# Patient Record
Sex: Male | Born: 2000
Health system: Southern US, Community
[De-identification: ages and names within clinical notes are randomized; demographics above are authoritative.]

## PROBLEM LIST (undated history)

## (undated) DIAGNOSIS — R430 Anosmia: Secondary | ICD-10-CM

---
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
## 2022-01-05 ENCOUNTER — Ambulatory Visit: Payer: 59 | Admitting: Physician Assistant

## 2022-01-05 IMAGING — DX DG CHEST 2V
2 series · 2 of 2 positions shown · non-contrast
Comparison: None.

CLINICAL DATA: Sore throat, malaise, cough, and fever starting
today

EXAM:
CHEST - 2 VIEW

[chest pa]
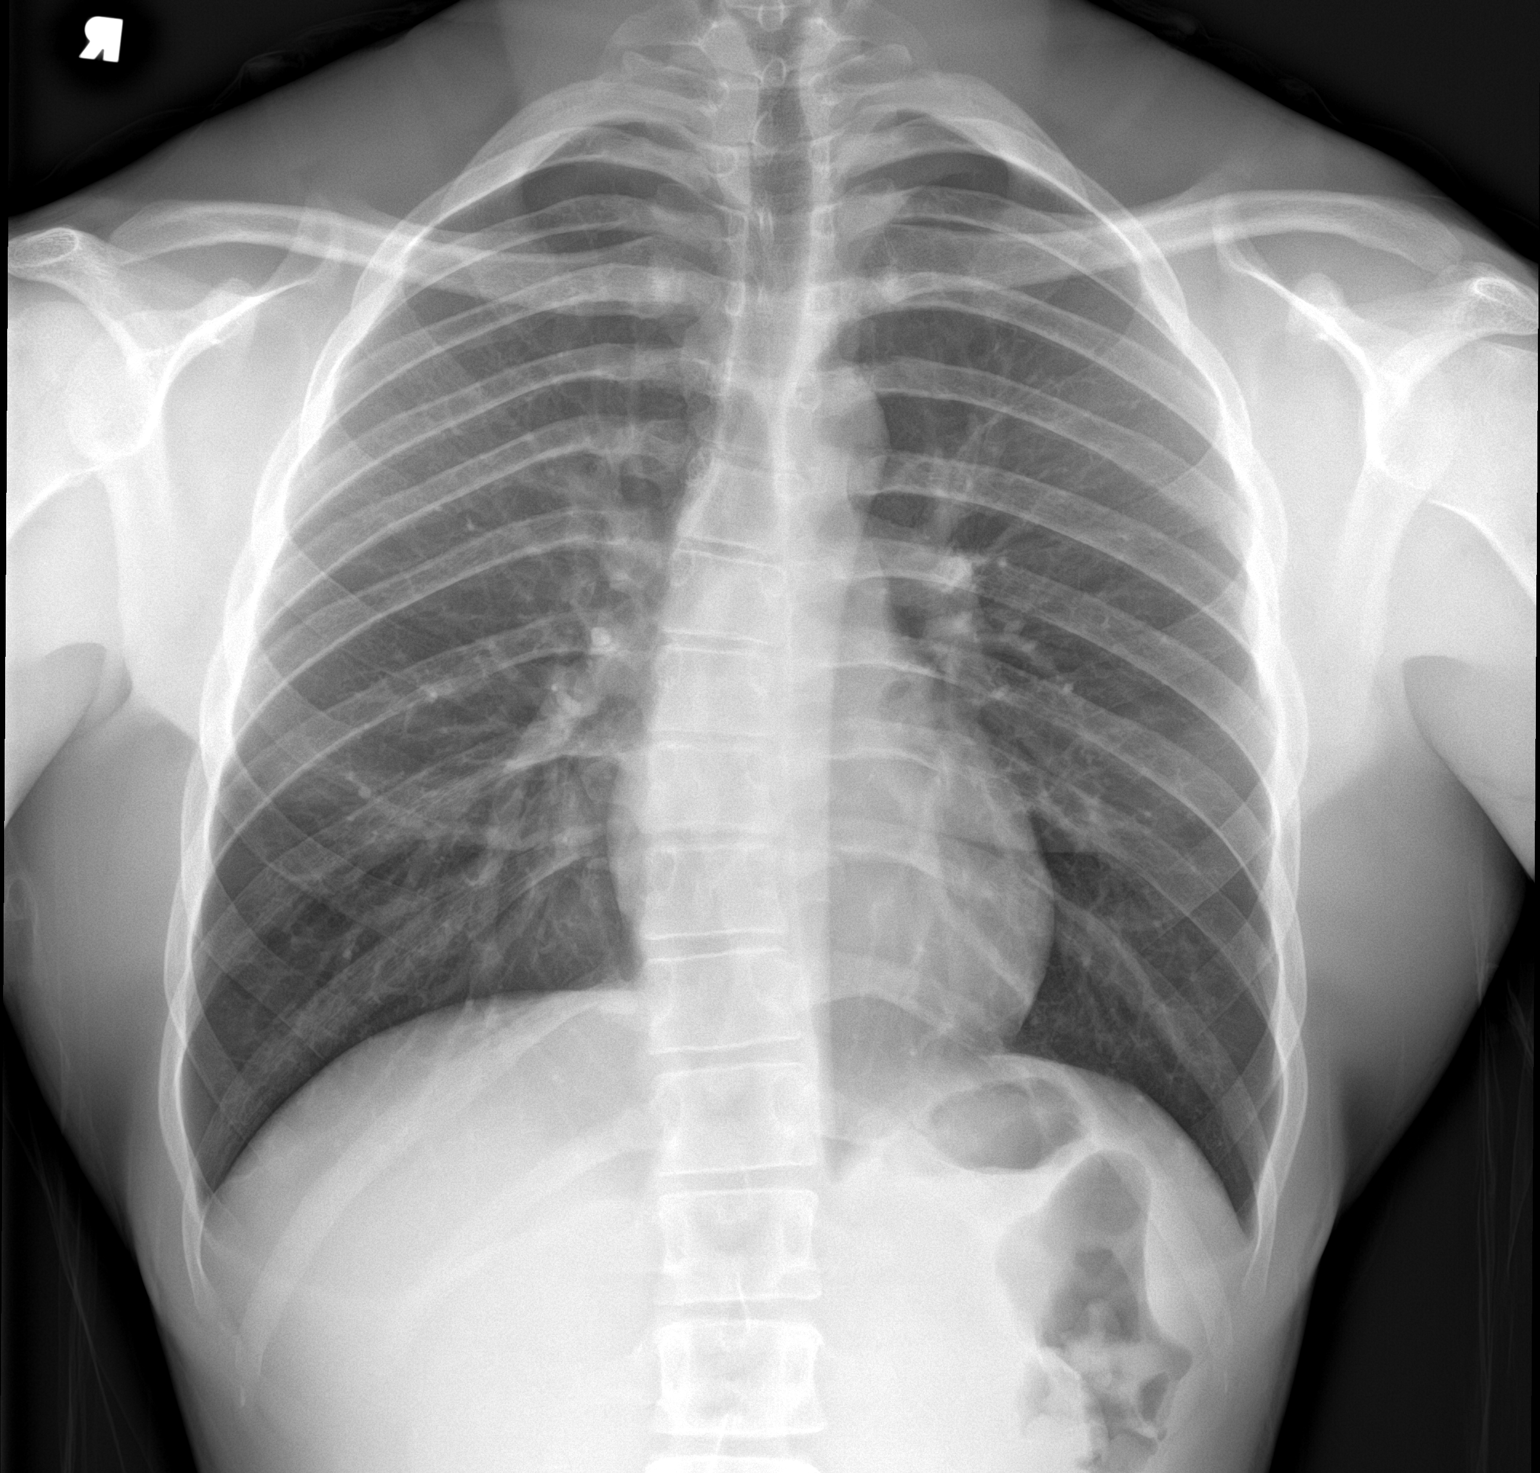

[chest lat]
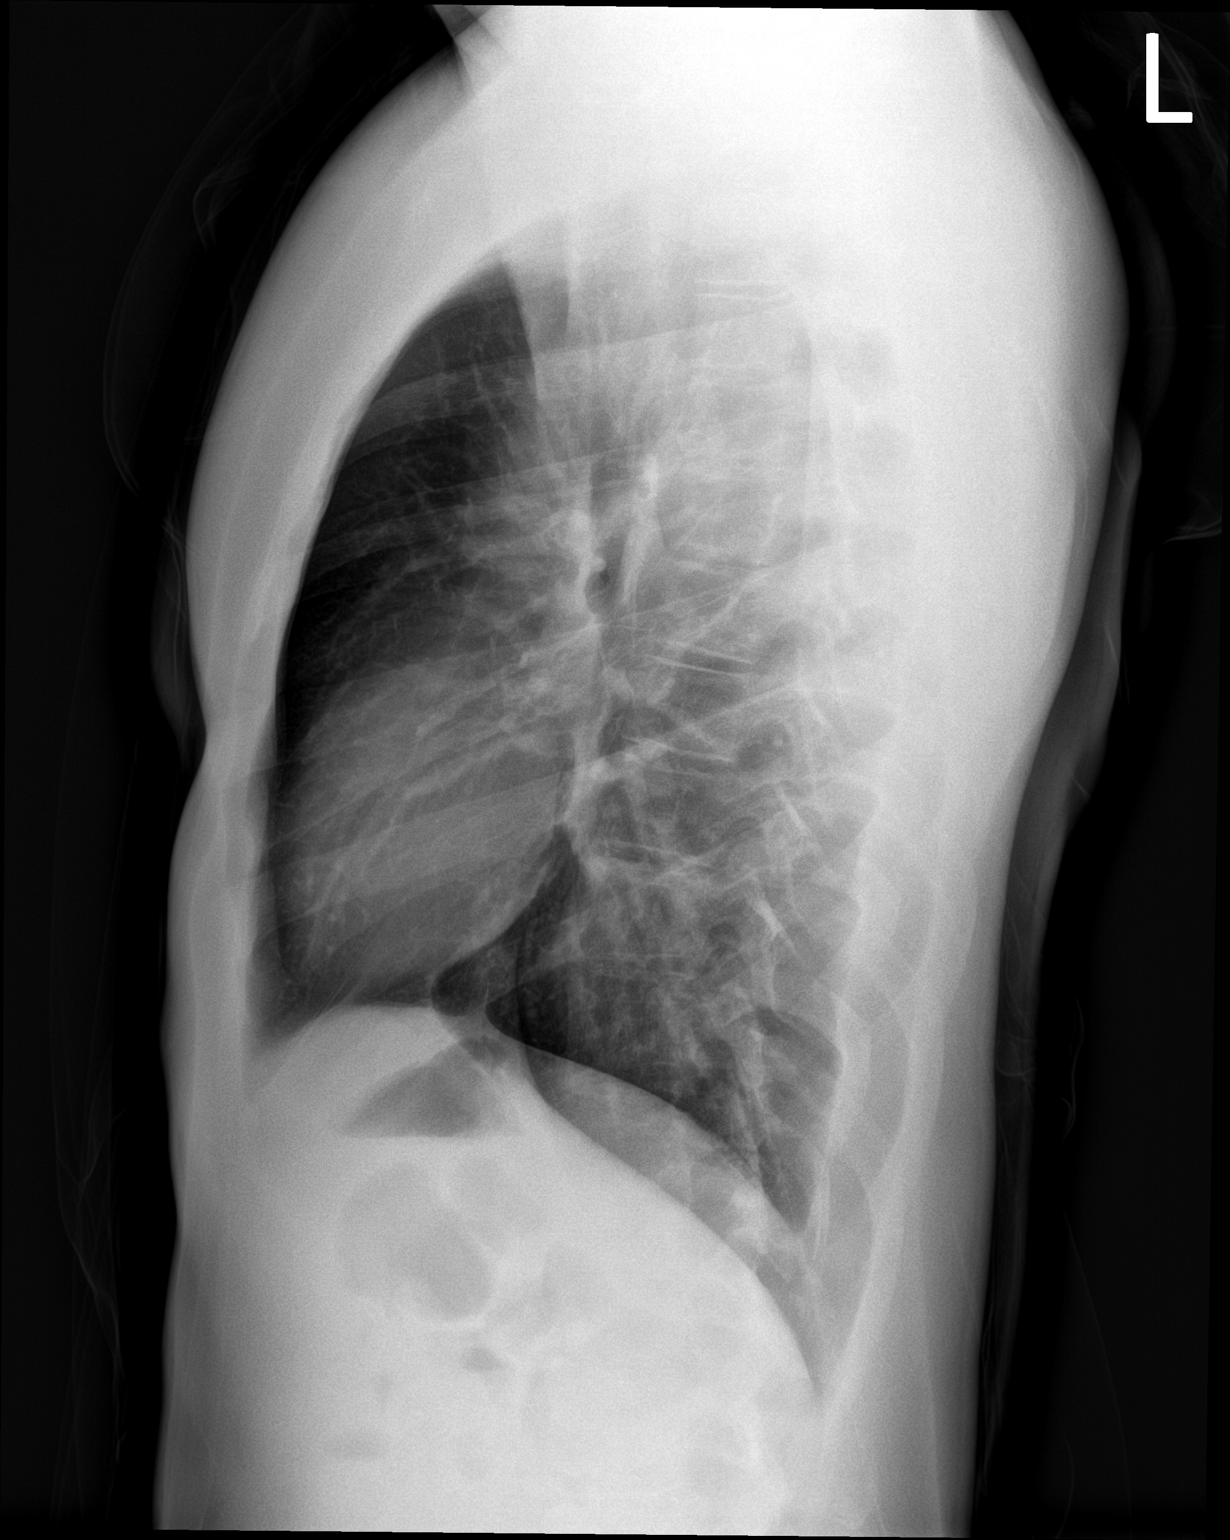

[2 of 2 positions shown; findings below may reference images not displayed]

FINDINGS: The lungs appear clear. Cardiac and mediastinal contours normal. No
pleural effusion identified.

There is 17 degrees of dextroconvex thoracic scoliosis as measured
between T6 and T11. No thoracic vertebral anomalies identified.
IMPRESSION: 1. The lungs appear clear.
2. 17 degrees of dextroconvex thoracic scoliosis between T6 and T11.

## 2022-01-05 NOTE — Progress Notes (Incomplete)
John Miles is a 21 y.o. male here to establish care.  SCRIBE STATEMENT  History of Present Illness:   No chief complaint on file.   Acute Concerns: ***  Chronic Issues: ***  Health Maintenance: Immunizations -- *** Colonoscopy -- *** Mammogram -- *** PAP -- *** Bone Density -- *** PSA -- *** Diet -- *** Caffeine intake -- *** Sleep habits -- *** Exercise -- *** Weight -- @FLOWAMB (14)@  Mood -- *** Alcohol -- *** Tobacco -- ***  No flowsheet data found.  No flowsheet data found.   Other providers/specialists: Patient Care Team: Wilfred Lacy, MD as PCP - General (Pediatrics)   Past Medical History:  Diagnosis Date   Congenital anosmia      Social History   Tobacco Use   Smoking status: Never   Smokeless tobacco: Never  Substance Use Topics   Alcohol use: Never   Drug use: Never    No past surgical history on file.  Family History  Problem Relation Age of Onset   Asthma Mother    Ulcers Mother    Colitis Mother    Irritable bowel syndrome Mother    Migraines Mother    Thyroid disease Mother    Hypertension Maternal Grandmother     No Known Allergies   Current Medications:   Current Outpatient Medications:    cetirizine (ZYRTEC) 10 MG tablet, Take 10 mg by mouth daily as needed for allergies. (Patient not taking: Reported on 11/13/2020), Disp: , Rfl:    Review of Systems:   ROS Negative unless otherwise specified per HPI. Vitals:   There were no vitals filed for this visit.    There is no height or weight on file to calculate BMI.  Physical Exam:   Physical Exam  Results for orders placed or performed during the hospital encounter of 11/12/21  Resp Panel by RT-PCR (Flu A&B, Covid) Nasopharyngeal Swab   Specimen: Nasopharyngeal Swab; Nasopharyngeal(NP) swabs in vial transport medium  Result Value Ref Range   SARS Coronavirus 2 by RT PCR POSITIVE (A) NEGATIVE   Influenza A by PCR NEGATIVE NEGATIVE   Influenza B by PCR  NEGATIVE NEGATIVE  Group A Strep by PCR   Specimen: Nasopharyngeal Swab; Sterile Swab  Result Value Ref Range   Group A Strep by PCR NOT DETECTED NOT DETECTED    Assessment and Plan:       I,Havlyn C Ratchford,acting as a scribe for [employer], PA.,have documented all relevant documentation on the behalf of Inda Coke, PA,as directed by  Inda Coke, PA while in the presence of Inda Coke, [place].  ***  Inda Coke, PA-C

## 2022-02-24 ENCOUNTER — Other Ambulatory Visit: Payer: Self-pay

## 2022-02-24 ENCOUNTER — Ambulatory Visit
Admission: EM | Admit: 2022-02-24 | Discharge: 2022-02-24 | Disposition: A | Discharge status: Home / Self Care | Payer: 59 | Attending: Physician Assistant | Admitting: Physician Assistant

## 2022-02-24 DIAGNOSIS — J029 Acute pharyngitis, unspecified: Secondary | ICD-10-CM

## 2022-02-24 LAB — POCT RAPID STREP A (OFFICE): Rapid Strep A Screen: NEGATIVE

## 2022-02-24 MED ORDER — AMOXICILLIN 500 MG PO CAPS: 500.0000 mg | ORAL_CAPSULE | Freq: Three times a day (TID) | ORAL | 0 refills | Status: DC

## 2022-02-24 NOTE — ED Provider Notes (Signed)
?[facility] ? ? ? ?CSN: [id number] ?Arrival date & time: 02/24/22  7564 ? ? ?  ? ?History   ?Chief Complaint ?No chief complaint on file. ? ? ?HPI ?John Miles is a 21 y.o. male.  ? ?Patient here today for evaluation of sore throat that started yesterday.  He reports pain with swallowing.  He notes he feels as if his throat is swollen.  He has had cough that lasted about a week and a half but this is resolved.  He denies any known fever.  He has had some congestion.  He has tried NyQuil with minimal relief. ? ?The history is provided by the patient.  ? ?Past Medical History:  ?Diagnosis Date  ? Congenital anosmia   ? ? ?Patient Active Problem List  ? Diagnosis Date Noted  ? 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 ? ?History reviewed. No pertinent surgical history. ? ? ? ? ?Home Medications   ? ?Prior to Admission medications   ?Medication Sig Start Date End Date Taking? Authorizing Provider  ?amoxicillin (AMOXIL) 500 MG capsule Take 1 capsule (500 mg total) by mouth 3 (three) times daily. 02/24/22  Yes Tomi Bamberger, PA-C  ?cetirizine (ZYRTEC) 10 MG tablet Take 10 mg by mouth daily as needed for allergies. ?Patient not taking: Reported on 11/13/2020    [provider]  ? ? ?Family History ?Family History  ?Problem Relation Age of Onset  ? Asthma Mother   ? Ulcers Mother   ? Colitis Mother   ? Irritable bowel syndrome Mother   ? Migraines Mother   ? Thyroid disease Mother   ? Hypertension Maternal Grandmother   ? ? ?Social History ?Social History  ? ?Tobacco Use  ? Smoking status: Never  ? Smokeless tobacco: Never  ?Substance Use Topics  ? Alcohol use: Never  ? Drug use: Never  ? ? ? ?Allergies   ?Patient has no known allergies. ? ? ?Review of Systems ?Review of Systems  ?Constitutional:  Negative for fever.  ?HENT:  Positive for congestion and sore throat. Negative for ear pain.   ?Eyes:  Negative for discharge and redness.  ?Respiratory:  Negative for cough and shortness of breath.    ?Gastrointestinal:  Negative for abdominal pain, diarrhea, nausea and vomiting.  ? ? ?Physical Exam ?Triage Vital Signs ?ED Triage Vitals  ?Enc Vitals Group  ?   BP   ?   Pulse   ?   Resp   ?   Temp   ?   Temp src   ?   SpO2   ?   Weight   ?   Height   ?   Head Circumference   ?   Peak Flow   ?   Pain Score   ?   Pain Loc   ?   Pain Edu?   ?   Excl. in GC?   ? ?No data found. ? ?Updated Vital Signs ?BP 119/79 (BP Location: Left Arm)   Pulse 80   Temp 98.7 ?F (37.1 ?C) (Oral)   Resp 16   SpO2 95%  ?   ? ?Physical Exam ?Vitals and nursing note reviewed.  ?Constitutional:   ?   General: He is not in acute distress. ?   Appearance: Normal appearance. He is not ill-appearing.  ?HENT:  ?   Head: Normocephalic and atraumatic.  ?   Nose: Nose normal. No congestion.  ?   Mouth/Throat:  ?   Mouth: Mucous membranes  are moist.  ?   Pharynx: Oropharynx is clear. Posterior oropharyngeal erythema present. No oropharyngeal exudate.  ?   Comments: Tonsils 3+ bilaterally ?Eyes:  ?   Conjunctiva/sclera: Conjunctivae normal.  ?Cardiovascular:  ?   Rate and Rhythm: Normal rate and regular rhythm.  ?   Heart sounds: Normal heart sounds. No murmur heard. ?Pulmonary:  ?   Effort: Pulmonary effort is normal. No respiratory distress.  ?   Breath sounds: Normal breath sounds. No wheezing, rhonchi or rales.  ?Skin: ?   General: Skin is warm and dry.  ?Neurological:  ?   Mental Status: He is alert.  ?Psychiatric:     ?   Mood and Affect: Mood normal.     ?   Thought Content: Thought content normal.  ? ? ? ?UC Treatments / Results  ?Labs ?(all labs ordered are listed, but only abnormal results are displayed) ?Labs Reviewed  ?CULTURE, GROUP A STREP [facility])  ?POCT RAPID STREP A (OFFICE)  ? ? ?EKG ? ? ?Radiology ?No results found. ? ?Procedures ?Procedures (including critical care time) ? ?Medications Ordered in UC ?Medications - No data to display ? ?Initial Impression / Assessment and Plan / UC Course  ?I have reviewed the triage vital signs  and the nursing notes. ? ?Pertinent labs & imaging results that were available during my care of the patient were reviewed by me and considered in my medical decision making (see chart for details). ? ?  ?Given appearance of tonsils will treat to cover strep with amoxicillin. Strep culture ordered. Recommended follow up if symptoms do not improve or worsen in any way. ? ?Final Clinical Impressions(s) / UC Diagnoses  ? ?Final diagnoses:  ?Acute pharyngitis, unspecified etiology  ? ?Discharge Instructions   ?None ?  ? ?ED Prescriptions   ? ? Medication Sig Dispense Auth. Provider  ? amoxicillin (AMOXIL) 500 MG capsule Take 1 capsule (500 mg total) by mouth 3 (three) times daily. 21 capsule Tomi Bamberger, PA-C  ? ?  ? ?PDMP not reviewed this encounter. ?  ?Tomi Bamberger, PA-C ?02/24/22 7948 ? ?

## 2022-02-24 NOTE — ED Triage Notes (Signed)
Onset 2 weeks ago, started with cough and congestion. Pt reports throat is sore. He took night quil last night. ?

## 2022-02-27 LAB — CULTURE, GROUP A STREP (THRC)

## 2023-08-21 ENCOUNTER — Encounter: Payer: Self-pay | Admitting: Family Medicine

## 2023-08-21 ENCOUNTER — Ambulatory Visit: Payer: 59 | Admitting: Family Medicine

## 2023-08-21 VITALS — BP 116/72 | HR 75 | Temp 97.0°F | Ht 67.0 in | Wt 145.4 lb

## 2023-08-21 DIAGNOSIS — Z113 Encounter for screening for infections with a predominantly sexual mode of transmission: Secondary | ICD-10-CM

## 2023-08-21 DIAGNOSIS — Z23 Encounter for immunization: Secondary | ICD-10-CM

## 2023-08-21 DIAGNOSIS — Z136 Encounter for screening for cardiovascular disorders: Secondary | ICD-10-CM

## 2023-08-21 DIAGNOSIS — Z0000 Encounter for general adult medical examination without abnormal findings: Secondary | ICD-10-CM | POA: Diagnosis not present

## 2023-08-21 DIAGNOSIS — R430 Anosmia: Secondary | ICD-10-CM

## 2023-08-21 DIAGNOSIS — R718 Other abnormality of red blood cells: Secondary | ICD-10-CM

## 2023-08-21 DIAGNOSIS — R824 Acetonuria: Secondary | ICD-10-CM

## 2023-08-21 DIAGNOSIS — N179 Acute kidney failure, unspecified: Secondary | ICD-10-CM

## 2023-08-21 NOTE — Assessment & Plan Note (Signed)
Repeat labs due to previous elevated creatinine and ketones.

## 2023-08-21 NOTE — Progress Notes (Signed)
Assessment  Assessment/Plan:   Problem List Items Addressed This Visit       Nervous and Auditory   Congenital anosmia     Genitourinary   AKI (acute kidney injury) (HCC)   Relevant Orders   Comprehensive metabolic panel   TSH   Urinalysis w microscopic + reflex cultur   Other Visit Diagnoses     Encounter for well adult exam without abnormal findings    -  Primary   Relevant Orders   TSH   Immunization due       Screening, heart disease, ischemic       Relevant Orders   Lipid panel   Screen for STD (sexually transmitted disease)       Relevant Orders   Hepatitis B core antibody, total   Hepatitis B surface antibody,qualitative   Hepatitis B surface antigen   HCV Ab w Reflex to Quant PCR   HIV Antibody (routine testing w rflx)   RPR   Chlamydia/Gonococcus/Trichomonas, NAA   Elevated hematocrit       Relevant Orders   CBC with Differential/Platelet   TSH   Ketonuria       Relevant Orders   TSH   Urinalysis w microscopic + reflex cultur   Need for HPV vaccine       Relevant Orders   HPV 9-valent vaccine,Recombinat (Completed)       Medications Discontinued During This Encounter  Medication Reason   amoxicillin (AMOXIL) 500 MG capsule    cetirizine (ZYRTEC) 10 MG tablet     Patient Counseling(The following topics were reviewed and/or handout was given):  -Nutrition: Stressed importance of moderation in sodium/caffeine intake, saturated fat and cholesterol, caloric balance, sufficient intake of fresh fruits, vegetables, and fiber.  -Stressed the importance of regular exercise.   -Substance Abuse: Discussed cessation/primary prevention of tobacco, alcohol, or other drug use; driving or other dangerous activities under the influence; availability of treatment for abuse.   -Injury prevention: Discussed safety belts, safety helmets, smoke detector, smoking near bedding or upholstery.   -Sexuality: Discussed sexually transmitted diseases, partner selection, use  of condoms, avoidance of unintended pregnancy and contraceptive alternatives.   -Dental health: Discussed importance of regular tooth brushing, flossing, and dental visits.  -Health maintenance and immunizations reviewed. Please refer to Health maintenance section.  Return to care in 1 year for next preventative visit.        Subjective:  Chief complaint Encounter date: 08/21/2023  Chief Complaint  Patient presents with   Establish Care    Would like CPE if possible   John Miles is a 22 y.o. male who presents today for his annual comprehensive physical exam.    History of Present Illness:   Patient, a 22 year old male, presents for an annual wellness physical and to establish care. He reports no current medical issues, no complaints or symptoms. He denies any history of medical conditions and is not on any medications or supplements.   Denies history of STD.   Lifestyle:  Diet: Attempts to eat fruit daily, usually consumes meals prepared by his mother, admits to needing more vegetables in his diet. Exercise: Regular calisthenics, push-ups, and bodyweight exercises, 30 minutes most days a week, plus some weight lifting.  Review of Systems  Constitutional:  Negative for chills, diaphoresis, fever, malaise/fatigue and weight loss.  HENT:  Negative for congestion, ear discharge, ear pain and hearing loss.   Eyes:  Negative for blurred vision, double vision, photophobia, pain, discharge and redness.  Respiratory:  Negative for cough, sputum production, shortness of breath and wheezing.   Cardiovascular:  Negative for chest pain, palpitations and leg swelling.  Gastrointestinal:  Negative for abdominal pain, blood in stool, constipation, diarrhea, heartburn, melena, nausea and vomiting.  Genitourinary:  Negative for dysuria, flank pain, frequency, hematuria and urgency.  Musculoskeletal:  Negative for back pain, joint pain and myalgias.  Skin:  Negative for itching and rash.   Neurological:  Negative for dizziness, tingling, tremors, speech change, seizures, loss of consciousness, weakness and headaches.  Endo/Heme/Allergies:  Negative for environmental allergies and polydipsia. Does not bruise/bleed easily.  Psychiatric/Behavioral:  Negative for depression, hallucinations, memory loss, substance abuse and suicidal ideas. The patient does not have insomnia.   All other systems reviewed and are negative.      08/21/2023    8:30 AM  Depression screen PHQ 2/9  Decreased Interest 0  Down, Depressed, Hopeless 0  PHQ - 2 Score 0  Altered sleeping 0  Tired, decreased energy 0  Change in appetite 0  Feeling bad or failure about yourself  0  Trouble concentrating 0  Moving slowly or fidgety/restless 0  Suicidal thoughts 0  PHQ-9 Score 0  Difficult doing work/chores Not difficult at all       08/21/2023    8:30 AM  GAD 7 : Generalized Anxiety Score  Nervous, Anxious, on Edge 0  Control/stop worrying 0  Worry too much - different things 0  Trouble relaxing 0  Restless 0  Easily annoyed or irritable 0  Afraid - awful might happen 0  Total GAD 7 Score 0  Anxiety Difficulty Not difficult at all    Health Maintenance Due  Topic Date Due   HIV Screening  Never done   Hepatitis C Screening  Never done     Dental: Not visited a dentist in the past year, recommended for annual check-ups. Vision: Visits an eye doctor annually, up to date.  PMH:  The following were reviewed and entered/updated in epic: Past Medical History:  Diagnosis Date   Congenital anosmia     Patient Active Problem List   Diagnosis Date Noted   AKI (acute kidney injury) (HCC) 08/21/2023   Congenital anosmia    Migraine aura without headache 07/29/2019    No past surgical history on file.  Family History  Problem Relation Age of Onset   Asthma Mother    Ulcers Mother    Colitis Mother    Irritable bowel syndrome Mother    Migraines Mother    Thyroid disease Mother     Hypertension Maternal Grandmother     Medications- reviewed and updated Outpatient Medications Prior to Visit  Medication Sig Dispense Refill   amoxicillin (AMOXIL) 500 MG capsule Take 1 capsule (500 mg total) by mouth 3 (three) times daily. (Patient not taking: Reported on 08/21/2023) 21 capsule 0   cetirizine (ZYRTEC) 10 MG tablet Take 10 mg by mouth daily as needed for allergies. (Patient not taking: Reported on 11/13/2020)     No facility-administered medications prior to visit.    No Known Allergies  Social History   Socioeconomic History   Marital status: Single    Spouse name: Not on file   Number of children: Not on file   Years of education: Not on file   Highest education level: Not on file  Occupational History   Not on file  Tobacco Use   Smoking status: Never   Smokeless tobacco: Never  Substance and Sexual Activity   Alcohol  use: Never   Drug use: Never   Sexual activity: Not Currently    Birth control/protection: Condom    Comment: uses protection inconsistently  Other Topics Concern   Not on file  Social History Narrative   [place] in 11th grade student at [employer].   He lives with both parents.   He has two siblings that live out of the home.    Social Determinants of Health   Financial Resource Strain: Not on file  Food Insecurity: Not on file  Transportation Needs: Not on file  Physical Activity: Not on file  Stress: Not on file  Social Connections: Not on file           Objective:  Physical Exam: BP 116/72 (BP Location: Left Arm, Patient Position: Sitting, Cuff Size: Large)   Pulse 75   Temp (!) 97 F (36.1 C) (Temporal)   Ht 5\' 7"  (1.702 m)   Wt 145 lb 6.4 oz (66 kg)   SpO2 98%   BMI 22.77 kg/m   Body mass index is 22.77 kg/m. Wt Readings from Last 3 Encounters:  08/21/23 145 lb 6.4 oz (66 kg)  11/17/21 134 lb 14.7 oz (61.2 kg) (24%, Z= -0.70)*  11/11/21 135 lb (61.2 kg) (24%, Z= -0.70)*   * Growth percentiles are  based on CDC (Boys, 2-20 Years) data.    Physical Exam Constitutional:      General: He is not in acute distress.    Appearance: Normal appearance. He is not ill-appearing or toxic-appearing.  HENT:     Head: Normocephalic and atraumatic.     Right Ear: Hearing, tympanic membrane, ear canal and external ear normal. There is no impacted cerumen.     Left Ear: Hearing, tympanic membrane, ear canal and external ear normal. There is no impacted cerumen.     Nose: Nose normal. No congestion.     Mouth/Throat:     Lips: No lesions.     Mouth: Mucous membranes are moist.     Pharynx: Oropharynx is clear. No oropharyngeal exudate.  Eyes:     General: No scleral icterus.       Right eye: No discharge.        Left eye: No discharge.     Conjunctiva/sclera: Conjunctivae normal.     Pupils: Pupils are equal, round, and reactive to light.  Neck:     Thyroid: No thyroid mass, thyromegaly or thyroid tenderness.  Cardiovascular:     Rate and Rhythm: Normal rate and regular rhythm.     Pulses: Normal pulses.     Heart sounds: Normal heart sounds.  Pulmonary:     Effort: Pulmonary effort is normal. No respiratory distress.     Breath sounds: Normal breath sounds.  Abdominal:     General: Abdomen is flat. Bowel sounds are normal.     Palpations: Abdomen is soft.  Musculoskeletal:        General: Normal range of motion.     Cervical back: Normal range of motion.     Right lower leg: No edema.     Left lower leg: No edema.  Lymphadenopathy:     Cervical: No cervical adenopathy.  Skin:    General: Skin is warm and dry.     Findings: No rash.  Neurological:     General: No focal deficit present.     Mental Status: He is alert and oriented to person, place, and time. Mental status is at baseline.  Deep Tendon Reflexes:     Reflex Scores:      Patellar reflexes are 2+ on the right side and 2+ on the left side. Psychiatric:        Mood and Affect: Mood normal.        Behavior: Behavior  normal.        Thought Content: Thought content normal.        Judgment: Judgment normal.    Lab Results  Component Value Date   BILIRUBINUR NEGATIVE 06/05/2019   PROTEINUR 30 (A) 06/05/2019   LEUKOCYTESUR NEGATIVE 06/05/2019   Last metabolic panel Lab Results  Component Value Date   GLUCOSE 84 06/05/2019   NA 138 06/05/2019   K 4.0 06/05/2019   CL 103 06/05/2019   CO2 22 06/05/2019   BUN 11 06/05/2019   CREATININE 1.14 (H) 06/05/2019   GFRNONAA NOT CALCULATED 06/05/2019   CALCIUM 9.8 06/05/2019   PROT 7.3 06/05/2019   ALBUMIN 4.5 06/05/2019   BILITOT 0.6 06/05/2019   ALKPHOS 78 06/05/2019   AST 26 06/05/2019   ALT 16 06/05/2019   ANIONGAP 13 06/05/2019         At today's visit, we discussed treatment options, associated risk and benefits, and engage in counseling as needed.  Additionally the following were reviewed: Past medical records, past medical and surgical history, family and social background, as well as relevant laboratory results, imaging findings, and specialty notes, where applicable.  This message was generated using dictation software, and as a result, it may contain unintentional typos or errors.  Nevertheless, extensive effort was made to accurately convey at the pertinent aspects of the patient visit.    There may have been are other unrelated non-urgent complaints, but due to the busy schedule and the amount of time already spent with him, time does not permit to address these issues at today's visit. Another appointment may have or has been requested to review these additional issues.     Thomes Dinning, MD, MS

## 2023-08-21 NOTE — Patient Instructions (Signed)
Come back in a week for fasting blood work. Eat a healthy diet with more fruits and vegetables. Exercise regularly, aiming for 30 minutes of moderate cardio most days. Use sunscreen when going outside to prevent skin cancer. Continue to avoid smoking and limit alcohol intake to not more than two drinks per day.

## 2023-08-28 ENCOUNTER — Other Ambulatory Visit (INDEPENDENT_AMBULATORY_CARE_PROVIDER_SITE_OTHER): Payer: 59

## 2023-08-28 DIAGNOSIS — N179 Acute kidney failure, unspecified: Secondary | ICD-10-CM | POA: Diagnosis not present

## 2023-08-28 DIAGNOSIS — Z136 Encounter for screening for cardiovascular disorders: Secondary | ICD-10-CM

## 2023-08-28 DIAGNOSIS — Z0000 Encounter for general adult medical examination without abnormal findings: Secondary | ICD-10-CM | POA: Diagnosis not present

## 2023-08-28 DIAGNOSIS — R824 Acetonuria: Secondary | ICD-10-CM

## 2023-08-28 DIAGNOSIS — Z113 Encounter for screening for infections with a predominantly sexual mode of transmission: Secondary | ICD-10-CM

## 2023-08-28 DIAGNOSIS — R718 Other abnormality of red blood cells: Secondary | ICD-10-CM

## 2023-08-28 LAB — COMPREHENSIVE METABOLIC PANEL
ALT: 16 U/L (ref 0–53)
AST: 23 U/L (ref 0–37)
Albumin: 4.4 g/dL (ref 3.5–5.2)
Alkaline Phosphatase: 45 U/L (ref 39–117)
BUN: 17 mg/dL (ref 6–23)
CO2: 29 mEq/L (ref 19–32)
Calcium: 9.8 mg/dL (ref 8.4–10.5)
Chloride: 103 mEq/L (ref 96–112)
Creatinine, Ser: 1.23 mg/dL (ref 0.40–1.50)
GFR: 84.69 mL/min (ref >60.00)
Glucose, Bld: 86 mg/dL (ref 70–99)
Potassium: 4.3 mEq/L (ref 3.5–5.1)
Sodium: 140 mEq/L (ref 135–145)
Total Bilirubin: 0.6 mg/dL (ref 0.2–1.2)
Total Protein: 7 g/dL (ref 6.0–8.3)

## 2023-08-28 LAB — CBC WITH DIFFERENTIAL/PLATELET
Basophils Absolute: 0 10*3/uL (ref 0.0–0.1)
Basophils Relative: 0.8 % (ref 0.0–3.0)
Eosinophils Absolute: 0 10*3/uL (ref 0.0–0.7)
Eosinophils Relative: 1 % (ref 0.0–5.0)
HCT: 44.7 % (ref 39.0–52.0)
Hemoglobin: 14.5 g/dL (ref 13.0–17.0)
Lymphocytes Relative: 40.1 % (ref 12.0–46.0)
Lymphs Abs: 1.7 10*3/uL (ref 0.7–4.0)
MCHC: 32.5 g/dL (ref 30.0–36.0)
MCV: 87.8 fl (ref 78.0–100.0)
Monocytes Absolute: 0.3 10*3/uL (ref 0.1–1.0)
Monocytes Relative: 6.1 % (ref 3.0–12.0)
Neutro Abs: 2.1 10*3/uL (ref 1.4–7.7)
Neutrophils Relative %: 52 % (ref 43.0–77.0)
Platelets: 209 10*3/uL (ref 150.0–400.0)
RBC: 5.09 Mil/uL (ref 4.22–5.81)
RDW: 13.4 % (ref 11.5–14.6)
WBC: 4.1 10*3/uL — ABNORMAL LOW (ref 4.5–10.5)

## 2023-08-28 LAB — LIPID PANEL
Cholesterol: 224 mg/dL — ABNORMAL HIGH (ref 0–200)
HDL: 54.6 mg/dL (ref >39.00)
LDL Cholesterol: 157 mg/dL — ABNORMAL HIGH (ref 0–99)
NonHDL: 169.15
Total CHOL/HDL Ratio: 4
Triglycerides: 59 mg/dL (ref 0.0–149.0)
VLDL: 11.8 mg/dL (ref 0.0–40.0)

## 2023-08-28 LAB — TSH: TSH: 2.93 u[IU]/mL (ref 0.35–5.50)

## 2023-08-29 LAB — URINALYSIS W MICROSCOPIC + REFLEX CULTURE
Bacteria, UA: NONE SEEN /HPF
Bilirubin Urine: NEGATIVE
Glucose, UA: NEGATIVE
Hgb urine dipstick: NEGATIVE
Hyaline Cast: NONE SEEN /LPF
Ketones, ur: NEGATIVE
Leukocyte Esterase: NEGATIVE
Nitrites, Initial: NEGATIVE
Protein, ur: NEGATIVE
RBC / HPF: NONE SEEN /HPF (ref 0–2)
Specific Gravity, Urine: 1.024 (ref 1.001–1.035)
Squamous Epithelial / HPF: NONE SEEN /HPF (ref <=5)
WBC, UA: NONE SEEN /HPF (ref 0–5)
pH: 7.5 (ref 5.0–8.0)

## 2023-08-29 LAB — NO CULTURE INDICATED

## 2023-08-29 LAB — HCV AB W REFLEX TO QUANT PCR: HCV Ab: NONREACTIVE

## 2023-08-29 LAB — HEPATITIS B CORE ANTIBODY, TOTAL: Hep B Core Total Ab: NONREACTIVE

## 2023-08-29 LAB — HEPATITIS B SURFACE ANTIBODY,QUALITATIVE: Hep B S Ab: NONREACTIVE

## 2023-08-29 LAB — HEPATITIS B SURFACE ANTIGEN: Hepatitis B Surface Ag: NONREACTIVE

## 2023-08-29 LAB — RPR: RPR Ser Ql: NONREACTIVE

## 2023-08-29 LAB — HIV ANTIBODY (ROUTINE TESTING W REFLEX): HIV 1&2 Ab, 4th Generation: NONREACTIVE

## 2023-08-29 LAB — HCV INTERPRETATION

## 2023-08-30 LAB — CHLAMYDIA/GONOCOCCUS/TRICHOMONAS, NAA
Chlamydia by NAA: NEGATIVE
Gonococcus by NAA: NEGATIVE
Trich vag by NAA: NEGATIVE

## 2024-01-25 ENCOUNTER — Ambulatory Visit (INDEPENDENT_AMBULATORY_CARE_PROVIDER_SITE_OTHER): Payer: 59

## 2024-01-25 ENCOUNTER — Ambulatory Visit: Payer: 59 | Admitting: Podiatry

## 2024-01-25 ENCOUNTER — Encounter: Payer: Self-pay | Admitting: Podiatry

## 2024-01-25 ENCOUNTER — Ambulatory Visit
Admission: EM | Admit: 2024-01-25 | Discharge: 2024-01-25 | Disposition: A | Discharge status: Home / Self Care | Payer: 59 | Attending: Family Medicine | Admitting: Family Medicine

## 2024-01-25 DIAGNOSIS — Z719 Counseling, unspecified: Secondary | ICD-10-CM | POA: Insufficient documentation

## 2024-01-25 DIAGNOSIS — J111 Influenza due to unidentified influenza virus with other respiratory manifestations: Secondary | ICD-10-CM | POA: Insufficient documentation

## 2024-01-25 DIAGNOSIS — Z68.41 Body mass index (BMI) pediatric, 5th percentile to less than 85th percentile for age: Secondary | ICD-10-CM | POA: Insufficient documentation

## 2024-01-25 DIAGNOSIS — J029 Acute pharyngitis, unspecified: Secondary | ICD-10-CM | POA: Insufficient documentation

## 2024-01-25 DIAGNOSIS — Z00129 Encounter for routine child health examination without abnormal findings: Secondary | ICD-10-CM | POA: Insufficient documentation

## 2024-01-25 DIAGNOSIS — J069 Acute upper respiratory infection, unspecified: Secondary | ICD-10-CM | POA: Insufficient documentation

## 2024-01-25 DIAGNOSIS — B9689 Other specified bacterial agents as the cause of diseases classified elsewhere: Secondary | ICD-10-CM | POA: Insufficient documentation

## 2024-01-25 DIAGNOSIS — J050 Acute obstructive laryngitis [croup]: Secondary | ICD-10-CM | POA: Insufficient documentation

## 2024-01-25 DIAGNOSIS — M79672 Pain in left foot: Secondary | ICD-10-CM

## 2024-01-25 DIAGNOSIS — R430 Anosmia: Secondary | ICD-10-CM | POA: Insufficient documentation

## 2024-01-25 DIAGNOSIS — J019 Acute sinusitis, unspecified: Secondary | ICD-10-CM | POA: Insufficient documentation

## 2024-01-25 DIAGNOSIS — R509 Fever, unspecified: Secondary | ICD-10-CM | POA: Insufficient documentation

## 2024-01-25 DIAGNOSIS — R109 Unspecified abdominal pain: Secondary | ICD-10-CM | POA: Insufficient documentation

## 2024-01-25 DIAGNOSIS — K5900 Constipation, unspecified: Secondary | ICD-10-CM | POA: Insufficient documentation

## 2024-01-25 DIAGNOSIS — H1013 Acute atopic conjunctivitis, bilateral: Secondary | ICD-10-CM | POA: Insufficient documentation

## 2024-01-25 DIAGNOSIS — J020 Streptococcal pharyngitis: Secondary | ICD-10-CM | POA: Insufficient documentation

## 2024-01-25 DIAGNOSIS — H1010 Acute atopic conjunctivitis, unspecified eye: Secondary | ICD-10-CM | POA: Insufficient documentation

## 2024-01-25 DIAGNOSIS — L309 Dermatitis, unspecified: Secondary | ICD-10-CM | POA: Insufficient documentation

## 2024-01-25 DIAGNOSIS — S92352A Displaced fracture of fifth metatarsal bone, left foot, initial encounter for closed fracture: Secondary | ICD-10-CM

## 2024-01-25 DIAGNOSIS — Z23 Encounter for immunization: Secondary | ICD-10-CM | POA: Insufficient documentation

## 2024-01-25 DIAGNOSIS — J309 Allergic rhinitis, unspecified: Secondary | ICD-10-CM | POA: Insufficient documentation

## 2024-01-25 MED ORDER — IBUPROFEN 800 MG PO TABS: 800.0000 mg | ORAL_TABLET | Freq: Three times a day (TID) | ORAL | 0 refills | Status: AC | PRN

## 2024-01-25 NOTE — Patient Instructions (Signed)
 You have a mildly displaced fifth metatarsal base fracture on your left foot.  You are given crutches and a boot in the emergency department.  Is important that you stay nonweightbearing on your left foot for the next 2 months as the bone heals without displacing the fracture fragment.  Today a compressive dressing was put on to help manage swelling.  Try and keep this on for 3 to 5 days.  You can rewash the Ace wrap's and continue to use them after this point.  Can also look for over-the-counter compression sleeves which may be helpful for this.  Recommend using rest, icing behind the ankles or behind the knee for appointments on, 20 minutes off, using compression such as as described above, and elevating the leg on pillows.  I do recommend sleeping in the boot in order to prevent motion of the foot or ankle which could cause the fragment to displace.  At the emergency department were given Tylenol and ibuprofen for pain.  I recommend limiting the ibuprofen use for 1 week or less as NSAID use for longer could slow down bone healing.  Include other medications like Motrin, Aleve, naproxen etc. Tylenol use is ok.

## 2024-01-25 NOTE — Discharge Instructions (Signed)
 There is a fracture of your fifth metatarsal, one of the long bones in your foot.  Take ibuprofen 800 mg--1 tab every 8 hours as needed for pain.  You can also take Tylenol 500 mg--2 every 6 hours as needed for pain.  When you are at home, you can take the boot off and ice and elevate that foot and ankle.

## 2024-01-25 NOTE — ED Triage Notes (Signed)
"  I was playing basketball this morning and rolled my ankle (left)".

## 2024-01-25 NOTE — ED Provider Notes (Signed)
 [facility]    CSN: [id number] Arrival date & time: 01/25/24  1114      History   Chief Complaint Chief Complaint  Patient presents with   Ankle Pain    HPI John Miles is a 23 y.o. male.    Ankle Pain Here for left ankle and foot pain.  This morning he was playing basketball and was about to jump when the rolled and inverted his left ankle.  He now has pain and swelling over the left lateral malleolus and over the lateral foot anterior and inferior to the lateral malleolus.  NKDA    Past Medical History:  Diagnosis Date   Congenital anosmia     Patient Active Problem List   Diagnosis Date Noted   Acute atopic conjunctivitis 01/25/2024   Acute pharyngitis 01/25/2024   Bacterial sinusitis 01/25/2024   Croup in pediatric patient 01/25/2024   Pharyngitis due to group A beta hemolytic Streptococci 01/25/2024   Sinusitis, acute 01/25/2024   Allergic rhinitis 01/25/2024   Allergic rhinoconjunctivitis of both eyes 01/25/2024   Chronic eczema 01/25/2024   Constipation 01/25/2024   Body mass index, pediatric, 5th percentile to less than 85th percentile for age 55/20/2025   Encounter for well child visit at 15 years of age 55/20/2025   Counseled by nurse 01/25/2024   Health education/counseling 01/25/2024   Fever 01/25/2024   Healthy child on routine physical examination 01/25/2024   Need for immunization against influenza 01/25/2024   Influenza vaccine administered 01/25/2024   Influenza 01/25/2024   Need for meningococcal vaccination 01/25/2024   URI (upper respiratory infection) 01/25/2024   Pain, abdominal 01/25/2024   Anosmia 01/25/2024   AKI (acute kidney injury) (HCC) 08/21/2023   Congenital anosmia    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 No pertinent surgical history.     Home Medications    Prior to Admission medications   Medication Sig Start Date End Date Taking? Authorizing Provider  ibuprofen (ADVIL)  800 MG tablet Take 1 tablet (800 mg total) by mouth every 8 (eight) hours as needed (pain). 01/25/24  Yes Crist Kruszka, Janace Aris, MD    Family History Family History  Problem Relation Age of Onset   Asthma Mother    Ulcers Mother    Colitis Mother    Irritable bowel syndrome Mother    Migraines Mother    Thyroid disease Mother    Hypertension Maternal Grandmother     Social History Social History   Tobacco Use   Smoking status: Never   Smokeless tobacco: Never  Vaping Use   Vaping status: Never Used  Substance Use Topics   Alcohol use: Never   Drug use: Never     Allergies   Patient has no known allergies.   Review of Systems Review of Systems   Physical Exam Triage Vital Signs ED Triage Vitals  Encounter Vitals Group     BP 01/25/24 1158 115/76     Systolic BP Percentile --      Diastolic BP Percentile --      Pulse Rate 01/25/24 1158 100     Resp 01/25/24 1158 18     Temp 01/25/24 1158 98.4 F (36.9 C)     Temp Source 01/25/24 1158 Oral     SpO2 01/25/24 1158 96 %     Weight 01/25/24 1158 144 lb (65.3 kg)     Height 01/25/24 1158 5\' 6"  (1.676 m)     Head Circumference --  Peak Flow --      Pain Score 01/25/24 1156 4     Pain Loc --      Pain Education --      Exclude from Growth Chart --    No data found.  Updated Vital Signs BP 115/76 (BP Location: Left Arm)   Pulse 100   Temp 98.4 F (36.9 C) (Oral)   Resp 18   Ht 5\' 6"  (1.676 m)   Wt 65.3 kg   SpO2 96%   BMI 23.24 kg/m   Visual Acuity Right Eye Distance:   Left Eye Distance:   Bilateral Distance:    Right Eye Near:   Left Eye Near:    Bilateral Near:     Physical Exam Vitals reviewed.  Constitutional:      General: He is not in acute distress.    Appearance: He is not toxic-appearing.  Musculoskeletal:     Comments: There is swelling of the lateral foot anterior and inferior to the left lateral malleolus and a little bit of tenderness over the left lateral malleolus.   Neurovascular is intact distally  Skin:    Coloration: Skin is not pale.  Neurological:     Mental Status: He is alert and oriented to person, place, and time.  Psychiatric:        Behavior: Behavior normal.      UC Treatments / Results  Labs (all labs ordered are listed, but only abnormal results are displayed) Labs Reviewed - No data to display  EKG   Radiology DG Foot Complete Left Result Date: 01/25/2024 CLINICAL DATA:  Same day radiograph EXAM: LEFT FOOT - COMPLETE 3+ VIEW COMPARISON:  None Available. FINDINGS: Revisualization of a mildly displaced fracture of the base of the fifth metatarsal. There is associated soft tissue edema. No additional fractures noted. There is some sclerosis and irregularity along the navicular bone with the medial cuneiform could reflect chronic stress reactive changes, summation artifact, or an unusual coalition. IMPRESSION: 1. Mildly displaced fracture of the base of the fifth metatarsal. 2. There is some sclerosis and irregularity along the navicular bone with the medial cuneiform could reflect chronic stress reactive changes, summation artifact, or an unusual coalition. Recommend correlation with point tenderness when clinically appropriate. Consider follow-up foot radiographs. Electronically Signed   By: Meda Klinefelter M.D.   On: 01/25/2024 12:33   DG Ankle Complete Left Result Date: 01/25/2024 CLINICAL DATA:  Pain. Swelling. Injury. EXAM: LEFT ANKLE COMPLETE - 3+ VIEW COMPARISON:  None Available. FINDINGS: There is a mildly displaced fracture at the base of the fifth metatarsal. There is associated soft tissue edema. Ankle mortise is preserved. IMPRESSION: Mildly displaced fracture at the base of the fifth metatarsal. Electronically Signed   By: Meda Klinefelter M.D.   On: 01/25/2024 12:18    Procedures Procedures (including critical care time)  Medications Ordered in UC Medications - No data to display  Initial Impression / Assessment  and Plan / UC Course  I have reviewed the triage vital signs and the nursing notes.  Pertinent labs & imaging results that were available during my care of the patient were reviewed by me and considered in my medical decision making (see chart for details).     There is a fracture at the base of the fifth metatarsal.  Ibuprofen is sent in for pain.  He declines my offer of a Toradol injection.  Crutches are also supplied along with the boot.  He is given contact information for  orthopedic/podiatry. Final Clinical Impressions(s) / UC Diagnoses   Final diagnoses:  Left foot pain     Discharge Instructions      There is a fracture of your fifth metatarsal, one of the long bones in your foot.  Take ibuprofen 800 mg--1 tab every 8 hours as needed for pain.  You can also take Tylenol 500 mg--2 every 6 hours as needed for pain.  When you are at home, you can take the boot off and ice and elevate that foot and ankle.     ED Prescriptions     Medication Sig Dispense Auth. Provider   ibuprofen (ADVIL) 800 MG tablet Take 1 tablet (800 mg total) by mouth every 8 (eight) hours as needed (pain). 21 tablet Kamika Goodloe, Janace Aris, MD      PDMP not reviewed this encounter.   Zenia Resides, MD 01/25/24 [phone]

## 2024-01-25 NOTE — Progress Notes (Signed)
       Chief Complaint  Patient presents with   Foot Pain    " I was playing basketball this morning and fell on the foot and its painful and swelling'     HPI: 23 y.o. male presents today with left foot pain.  States that he was playing basketball earlier today and rolled his ankle.  And severe pain with weightbearing.  Endorses swelling.  He went to emergency department.  He was given crutches and cam boot.  Past Medical History:  Diagnosis Date   Congenital anosmia     History reviewed. No pertinent surgical history.  No Known Allergies  ROS denies any nausea, vomiting, fever, chills, chest pain, shortness of breath   Physical Exam: There were no vitals filed for this visit.  General: The patient is alert and oriented x3 in no acute distress.  Dermatology: Skin is warm, dry and supple bilateral lower extremities. Interspaces are clear of maceration and debris.  No ecchymosis to left lateral foot and ankle  Vascular: Palpable pedal pulses bilaterally. Capillary refill within normal limits.  No diffuse edema.  No erythema or calor.  Neurological: Light touch sensation grossly intact bilateral feet.   Musculoskeletal Exam: Left lateral foot and ankle edematous.  Pain on palpation of the styloid process, lateral hindfoot.  Manual muscle strength testing deferred.  No pain over [place] joint.  Dorsal TMT  Radiographic Exam: Left foot and ankle 01/25/2024 Outside films reviewed.  Mildly displaced fifth metatarsal base avulsion fracture noted.  Some irregularity appreciated first [place] joint which does not correlate with any pain or limitation range of motion on exam.  Assessment/Plan of Care: 1. Closed fracture of base of fifth metatarsal bone of left foot      No orders of the defined types were placed in this encounter.  None  Discussed clinical findings with patient today.  Radiographs reviewed with patient  Compressive Ace bandage applied to the left foot.  Discussed RICE  therapy.  Strict nonweightbearing in cam boot with use of crutches.  Discussed timeline for bone healing of approximately 8 weeks for conservative management.  Patient expressed good understanding.  Keep extremity elevated.  Keep cam boot applied.  Recommend Tylenol for pain.  He can take NSAIDs as prescribed by ED however advised that he avoid this for longer than 5 to 7 days as this can slow down bone healing.  Follow-up in 4 weeks for x-ray and fracture check.   Shraddha Lebron L. Marchia Bond, AACFAS [facility]     [address] [place], [place] [address]                Office [phone]  Fax [phone]

## 2024-02-04 ENCOUNTER — Other Ambulatory Visit: Payer: Self-pay

## 2024-02-04 ENCOUNTER — Encounter: Payer: Self-pay | Admitting: *Deleted

## 2024-02-04 ENCOUNTER — Ambulatory Visit
Admission: EM | Admit: 2024-02-04 | Discharge: 2024-02-04 | Disposition: A | Discharge status: Home / Self Care | Attending: Physician Assistant | Admitting: Physician Assistant

## 2024-02-04 DIAGNOSIS — L03011 Cellulitis of right finger: Secondary | ICD-10-CM

## 2024-02-04 DIAGNOSIS — Z0000 Encounter for general adult medical examination without abnormal findings: Secondary | ICD-10-CM | POA: Insufficient documentation

## 2024-02-04 DIAGNOSIS — G4733 Obstructive sleep apnea (adult) (pediatric): Secondary | ICD-10-CM | POA: Insufficient documentation

## 2024-02-04 MED ORDER — DOXYCYCLINE HYCLATE 100 MG PO CAPS: 100.0000 mg | ORAL_CAPSULE | Freq: Two times a day (BID) | ORAL | 0 refills | Status: AC

## 2024-02-04 NOTE — ED Triage Notes (Signed)
 Pt has an infection on base of RT middle finger for 1 week. Pt reports site drained yesterday.

## 2024-02-04 NOTE — ED Provider Notes (Signed)
 [facility]    CSN: [id number] Arrival date & time: 02/04/24  0805      History   Chief Complaint Chief Complaint  Patient presents with   skin infection    HPI John Miles is a 23 y.o. male.   Patient here today for evaluation of swelling and pain to his right middle finger cuticle area. He reports this started a week ago. He has yellow purulent drainage from the area yesterday. He has not had any fever.   The history is provided by the patient.    Past Medical History:  Diagnosis Date   Congenital anosmia     Patient Active Problem List   Diagnosis Date Noted   Severe obstructive sleep apnea 02/04/2024   Routine check-up 02/04/2024   Acute atopic conjunctivitis 01/25/2024   Acute pharyngitis 01/25/2024   Bacterial sinusitis 01/25/2024   Croup in pediatric patient 01/25/2024   Pharyngitis due to group A beta hemolytic Streptococci 01/25/2024   Sinusitis, acute 01/25/2024   Allergic rhinitis 01/25/2024   Allergic rhinoconjunctivitis of both eyes 01/25/2024   Chronic eczema 01/25/2024   Constipation 01/25/2024   Body mass index, pediatric, 5th percentile to less than 85th percentile for age 49/20/2025   Encounter for well child visit at 70 years of age 49/20/2025   Counseled by nurse 01/25/2024   Health education/counseling 01/25/2024   Fever 01/25/2024   Healthy child on routine physical examination 01/25/2024   Need for immunization against influenza 01/25/2024   Influenza vaccine administered 01/25/2024   Influenza 01/25/2024   Need for meningococcal vaccination 01/25/2024   URI (upper respiratory infection) 01/25/2024   Pain, abdominal 01/25/2024   Anosmia 01/25/2024   AKI (acute kidney injury) (HCC) 08/21/2023   Congenital anosmia    Migraine aura without headache 07/29/2019   Lead exposure 07/16/2004    History reviewed. No pertinent surgical history.     Home Medications    Prior to Admission medications   Medication Sig Start  Date End Date Taking? Authorizing Provider  doxycycline (VIBRAMYCIN) 100 MG capsule Take 1 capsule (100 mg total) by mouth 2 (two) times daily for 7 days. 02/04/24 02/11/24 Yes Tomi Bamberger, PA-C  ibuprofen (ADVIL) 800 MG tablet Take 1 tablet (800 mg total) by mouth every 8 (eight) hours as needed (pain). 01/25/24   Zenia Resides, MD    Family History Family History  Problem Relation Age of Onset   Asthma Mother    Ulcers Mother    Colitis Mother    Irritable bowel syndrome Mother    Migraines Mother    Thyroid disease Mother    Hypertension Maternal Grandmother     Social History Social History   Tobacco Use   Smoking status: Never   Smokeless tobacco: Never  Vaping Use   Vaping status: Never Used  Substance Use Topics   Alcohol use: Never   Drug use: Never     Allergies   Patient has no known allergies.   Review of Systems Review of Systems  Constitutional:  Negative for chills and fever.  Eyes:  Negative for discharge and redness.  Respiratory:  Negative for shortness of breath.   Skin:  Positive for color change and wound.  Neurological:  Negative for numbness.     Physical Exam Triage Vital Signs ED Triage Vitals  Encounter Vitals Group     BP 02/04/24 0823 118/77     Systolic BP Percentile --      Diastolic BP Percentile --  Pulse Rate 02/04/24 0823 65     Resp 02/04/24 0823 18     Temp 02/04/24 0823 98.5 F (36.9 C)     Temp src --      SpO2 02/04/24 0823 97 %     Weight --      Height --      Head Circumference --      Peak Flow --      Pain Score 02/04/24 0822 4     Pain Loc --      Pain Education --      Exclude from Growth Chart --    No data found.  Updated Vital Signs BP 118/77   Pulse 65   Temp 98.5 F (36.9 C)   Resp 18   SpO2 97%   Visual Acuity Right Eye Distance:   Left Eye Distance:   Bilateral Distance:    Right Eye Near:   Left Eye Near:    Bilateral Near:     Physical Exam Vitals and nursing note  reviewed.  Constitutional:      General: He is not in acute distress.    Appearance: Normal appearance. He is not ill-appearing.  HENT:     Head: Normocephalic and atraumatic.  Eyes:     Conjunctiva/sclera: Conjunctivae normal.  Cardiovascular:     Rate and Rhythm: Normal rate.  Pulmonary:     Effort: Pulmonary effort is normal. No respiratory distress.  Musculoskeletal:     Comments: Normal ROM of right middle finger  Skin:    Comments: Mild swelling to base of right middle finger cuticle, no fluid collection appreciated.   Neurological:     Mental Status: He is alert.     Comments: Sensation intact to distal right middle finger  Psychiatric:        Mood and Affect: Mood normal.        Behavior: Behavior normal.        Thought Content: Thought content normal.      UC Treatments / Results  Labs (all labs ordered are listed, but only abnormal results are displayed) Labs Reviewed - No data to display  EKG   Radiology No results found.  Procedures Procedures (including critical care time)  Medications Ordered in UC Medications - No data to display  Initial Impression / Assessment and Plan / UC Course  I have reviewed the triage vital signs and the nursing notes.  Pertinent labs & imaging results that were available during my care of the patient were reviewed by me and considered in my medical decision making (see chart for details).    No clear indication for incision and drainage given spontaneous drainage. Recommended warm epsom salt soaks and will treat with doxycycline. Encouraged follow up if no gradual improvement or with any further concerns.   Final Clinical Impressions(s) / UC Diagnoses   Final diagnoses:  Paronychia of finger of right hand   Discharge Instructions   None    ED Prescriptions     Medication Sig Dispense Auth. Provider   doxycycline (VIBRAMYCIN) 100 MG capsule Take 1 capsule (100 mg total) by mouth 2 (two) times daily for 7 days. 14  capsule Tomi Bamberger, PA-C      PDMP not reviewed this encounter.   Tomi Bamberger, PA-C 02/04/24 [phone]

## 2024-02-22 ENCOUNTER — Encounter: Payer: Self-pay | Admitting: Podiatry

## 2024-02-22 ENCOUNTER — Ambulatory Visit: Payer: 59 | Admitting: Podiatry

## 2024-02-22 ENCOUNTER — Ambulatory Visit (INDEPENDENT_AMBULATORY_CARE_PROVIDER_SITE_OTHER)

## 2024-02-22 DIAGNOSIS — S92352A Displaced fracture of fifth metatarsal bone, left foot, initial encounter for closed fracture: Secondary | ICD-10-CM

## 2024-02-22 NOTE — Progress Notes (Signed)
       Chief Complaint  Patient presents with   Fracture    Closed fracture of base of fifth metatarsal bone of left foot/ Pt states that he has no pain and foot is feeling fine. No swelling noted.    HPI: 23 y.o. male presents today for follow-up evaluation of left fifth metatarsal base avulsion fracture.  He has been compliant with nonweightbearing and use of the cam boot.  He denies pain today.  Past Medical History:  Diagnosis Date   Congenital anosmia     History reviewed. No pertinent surgical history.  No Known Allergies  ROS denies any nausea, vomiting, fever, chills, chest pain, shortness of breath   Physical Exam: There were no vitals filed for this visit.  General: The patient is alert and oriented x3 in no acute distress.  Dermatology: Skin is warm, dry and supple bilateral lower extremities. Interspaces are clear of maceration and debris.  No ecchymosis to left lateral foot and ankle  Vascular: Palpable pedal pulses bilaterally. Capillary refill within normal limits.  No diffuse edema.  No erythema or calor.  Neurological: Light touch sensation grossly intact bilateral feet.   Musculoskeletal Exam: Mild pain on palpation of the styloid process, lateral hindfoot.  Manual muscle strength testing deferred.   Radiographic Exam: Left foot and ankle 02/22/2024 Alignment of left fifth metatarsal base avulsion fracture fragment maintained.  Some mild resorption of the fracture fragment noted.  Some evidence of early trabeculation seen on lateral view and oblique view.  Assessment/Plan of Care: 1. Closed fracture of base of fifth metatarsal bone of left foot      No orders of the defined types were placed in this encounter.  None  Discussed clinical findings with patient today.  Radiographs reviewed with patient  Patient progressing as expected at this point.  At this point avoid any regular NSAID use.  Ace wrap reapplied to left foot.  Continue RICE therapy.   Continue use of cam boot with strict nonweightbearing.  Continue use of crutches.  Follow-up in 4 weeks for x-ray and fracture check.   Jovonne Wilton L. Marchia Bond, AACFAS [facility]     [address] [place], [place] [address]                Office [phone]  Fax [phone]

## 2024-02-25 ENCOUNTER — Encounter: Payer: Self-pay | Admitting: Podiatry

## 2024-04-04 ENCOUNTER — Ambulatory Visit: Admitting: Podiatry

## 2024-04-04 ENCOUNTER — Encounter: Payer: Self-pay | Admitting: Podiatry

## 2024-04-04 ENCOUNTER — Ambulatory Visit (INDEPENDENT_AMBULATORY_CARE_PROVIDER_SITE_OTHER)

## 2024-04-04 DIAGNOSIS — S92352A Displaced fracture of fifth metatarsal bone, left foot, initial encounter for closed fracture: Secondary | ICD-10-CM | POA: Diagnosis not present

## 2024-04-04 NOTE — Patient Instructions (Addendum)
 Ankle Sprain   An ankle sprain is a stretch or tear in a ligament in the ankle. Ligaments are tissues that connect bones to each other. The two most common types of ankle sprains are: Inversion sprain. This happens when the foot turns inward and the ankle rolls outward. It affects the ligament on the outside of the foot (lateral ligament). Eversion sprain. This happens when the foot turns outward and the ankle rolls inward. It affects the ligament on the inner side of the foot (medial ligament). What are the causes? This condition is often caused by accidentally rolling or twisting the ankle. What increases the risk? You are more likely to develop this condition if you play sports. What are the signs or symptoms?  Symptoms of this condition include: Pain in your ankle. Swelling. Bruising. This may develop right after you sprain your ankle or 1-2 days later. Trouble standing or walking, especially when you turn or change directions. How is this diagnosed? This condition is diagnosed with: A physical exam. During the exam, your health care provider will press on certain parts of your foot and ankle and try to move them in certain ways. X-ray imaging. These may be taken to see how severe the sprain is and to check for broken bones. How is this treated? This condition may be treated with: A brace or splint. This is used to keep the ankle from moving until it heals. An elastic bandage. This is used to support the ankle. Crutches. Pain medicine. Surgery. This may be needed if the sprain is severe. Physical therapy. This may help to improve the range of motion in the ankle. Follow these instructions at home: If you have a brace or a splint: Wear the brace or splint as told by your health care provider. Remove it only as told by your health care provider. Loosen the brace or splint if your toes tingle, become numb, or turn cold and blue. Keep the brace or splint clean. If the brace or  splint is not waterproof: Do not let it get wet. Cover it with a watertight covering when you take a bath or a shower. If you have an elastic bandage (dressing): Remove it to shower or bathe. Try not to move your ankle much, but wiggle your toes from time to time. This helps to prevent swelling. Adjust the dressing to make it more comfortable if it feels too tight. Loosen the dressing if you have numbness or tingling in your foot, or if your foot becomes cold and blue. Managing pain, stiffness, and swelling   Take over-the-counter and prescription medicines only as told by your health care provider. For 2-3 days, keep your ankle raised (elevated) above the level of your heart as much as possible. If directed, put ice on the injured area: If you have a removable brace or splint, remove it as told by your health care provider. Put ice in a plastic bag. Place a towel between your skin and the bag. Leave the ice on for 20 minutes, 2-3 times a day. General instructions Rest your ankle. Do not use the injured limb to support your body weight until your health care provider says that you can. Use crutches as told by your health care provider. Do not use any products that contain nicotine or tobacco, such as cigarettes, e-cigarettes, and chewing tobacco. If you need help quitting, ask your health care provider. Keep all follow-up visits as told by your health care provider. This is important. Contact a  health care provider if: You have rapidly increasing bruising or swelling. Your pain is not relieved with medicine. Get help right away if: Your foot or toes become numb or blue. You have severe pain that gets worse. Summary An ankle sprain is a stretch or tear in a ligament in the ankle. Ligaments are tissues that connect bones to each other. This condition is often caused by accidentally rolling or twisting the ankle. Symptoms include pain, swelling, bruising, and trouble walking. To relieve  pain and swelling, put ice on the affected ankle, raise your ankle above the level of your heart, and use an elastic bandage. Keep all follow-up visits as told by your health care provider. This is important. This information is not intended to replace advice given to you by your health care provider. Make sure you discuss any questions you have with your health care provider. Document Revised: 08/13/2018 Document Reviewed: 04/17/2018 Elsevier Patient Education  2020 [employer]  Ankle Sprain, Phase I Rehab An ankle sprain is an injury to the ligaments of your ankle. Ankle sprains cause stiffness, loss of motion, and loss of strength. Ask your health care provider which exercises are safe for you. Do exercises exactly as told by your health care provider and adjust them as directed. It is normal to feel mild stretching, pulling, tightness, or discomfort as you do these exercises. Stop right away if you feel sudden pain or your pain gets worse. Do not begin these exercises until told by your health care provider. Stretching and range-of-motion exercises These exercises warm up your muscles and joints and improve the movement and flexibility of your lower leg and ankle. These exercises also help to relieve pain and stiffness. Gastroc and soleus stretch  This exercise is also called a calf stretch. It stretches the muscles in the back of the lower leg. These muscles are the gastrocnemius, or gastroc, and the soleus. Sit on the floor with your left / right leg extended. Loop a belt or towel around the ball of your left / right foot. The ball of your foot is on the walking surface, right under your toes. Keep your left / right ankle and foot relaxed and keep your knee straight while you use the belt or towel to pull your foot toward you. You should feel a gentle stretch behind your calf or knee in your gastroc muscle. Hold this position for 15 seconds, then release to the starting position. Repeat the  exercise with your knee bent. You can put a pillow or a rolled bath towel under your knee to support it. You should feel a stretch deep in your calf in the soleus muscle or at your Achilles tendon. Repeat 5 times. Complete this exercise 2 times a day. Ankle alphabet   Sit with your left / right leg supported at the lower leg. Do not rest your foot on anything. Make sure your foot has room to move freely. Think of your left / right foot as a paintbrush. Move your foot to trace each letter of the alphabet in the air. Keep your hip and knee still while you trace. Make the letters as large as you can without feeling discomfort. Trace every letter from A to Z. Repeat 5 times. Complete this exercise 2 times a day. Strengthening exercises These exercises build strength and endurance in your ankle and lower leg. Endurance is the ability to use your muscles for a long time, even after they get tired. Ankle dorsiflexion   Secure  a rubber exercise band or tube to an object, such as a table leg, that will stay still when the band is pulled. Secure the other end around your left / right foot. Sit on the floor facing the object, with your left / right leg extended. The band or tube should be slightly tense when your foot is relaxed. Slowly bring your foot toward you, bringing the top of your foot toward your shin (dorsiflexion), and pulling the band tighter. Hold this position for 15 seconds. Slowly return your foot to the starting position. Repeat 5 times. Complete this exercise 2 times a day. Ankle plantar flexion   Sit on the floor with your left / right leg extended. Loop a rubber exercise tube or band around the ball of your left / right foot. The ball of your foot is on the walking surface, right under your toes. Hold the ends of the band or tube in your hands. The band or tube should be slightly tense when your foot is relaxed. Slowly point your foot and toes downward to tilt the top of your  foot away from your shin (plantar flexion). Hold this position for 15 seconds. Slowly return your foot to the starting position. Repeat 5 times. Complete this exercise 2times a day. Ankle eversion Sit on the floor with your legs straight out in front of you. Loop a rubber exercise band or tube around the ball of your left / right foot. The ball of your foot is on the walking surface, right under your toes. Hold the ends of the band in your hands, or secure the band to a stable object. The band or tube should be slightly tense when your foot is relaxed. Slowly push your foot outward, away from your other leg (eversion). Hold this position for 15 seconds. Slowly return your foot to the starting position. Repeat __________ times. Complete this exercise __________ times a day. This information is not intended to replace advice given to you by your health care provider. Make sure you discuss any questions you have with your health care provider. Document Revised: 03/12/2019 Document Reviewed: 09/03/2018 Elsevier Patient Education  2020 [employer]    30 Minute Rule: Begin to transition to more weight in the CAM boot on the left leg with normal daily activities (e.g. going to work or running errands, walking around the house If you have pain and swelling for more than 30 minutes following that activity, it's likely too much too soon and should decrease your distance/activity/weight/time the next time you do it If you have some pain and swelling but doesn't last more than 30 minutes, that activity is OK and you can begin to increase your distance/activity/weight/time the next time you do it

## 2024-04-04 NOTE — Progress Notes (Unsigned)
       Chief Complaint  Patient presents with   Fracture    Rm 15 f/u Fx 5th met left/reports no pain/not diabetic/no swelling     HPI: 23 y.o. male presents today for follow-up evaluation of left fifth metatarsal base avulsion fracture.  He has been compliant with nonweightbearing and use of the cam boot.  He denies pain today.  Date of injury 2/20.  Past Medical History:  Diagnosis Date   Congenital anosmia     History reviewed. No pertinent surgical history.  No Known Allergies  ROS denies any nausea, vomiting, fever, chills, chest pain, shortness of breath   Physical Exam: There were no vitals filed for this visit.  General: The patient is alert and oriented x3 in no acute distress.  Dermatology: Skin is warm, dry and supple bilateral lower extremities. Interspaces are clear of maceration and debris.  No ecchymosis to left lateral foot and ankle  Vascular: Palpable pedal pulses bilaterally. Capillary refill within normal limits.  No diffuse edema.  No erythema or calor.  Neurological: Light touch sensation grossly intact bilateral feet.   Musculoskeletal Exam: No pain on palpation of the styloid process, lateral hindfoot.  Muscle strength 5/5 in all major muscle groups  Radiographic Exam: Left foot 3 views weightbearing 04/06/2024 Trabeculation across fracture site noted. Fracture fragment appearance similar to os peroneum though this does appear to be nearly fully healed.  Assessment/Plan of Care: 1. Closed fracture of base of fifth metatarsal bone of left foot      No orders of the defined types were placed in this encounter.  DG FOOT COMPLETE LEFT  Discussed clinical findings with patient today.  Radiographs reviewed with patient  Patient progressing as expected at this point.  Instructed patient to remain in CAM boot for 2 more weeks he can begin to weight-bear at this point.  After 2 weeks begin to slowly transition to regular shoe gear using 30-minute  rule.  Discussed home rehab exercises as he transitions to progressing with weightbearing.  Follow-up as needed in 3 to 4 weeks if he experiences pain or any setbacks or feels that he needs any physical therapy.   Mir Fullilove L. Lunda Salines, AACFAS [facility]     [address] [place] [employer], [place] [address]                Office [phone]  Fax [phone]

## 2024-04-06 ENCOUNTER — Encounter: Payer: Self-pay | Admitting: Podiatry

## 2024-05-23 ENCOUNTER — Encounter: Payer: Self-pay | Admitting: Family Medicine
